# Patient Record
Sex: Female | Born: 1970 | Race: White | Hispanic: No | Marital: Married | Smoking: Never smoker
Health system: Southern US, Community
[De-identification: ages and names within clinical notes are randomized; demographics above are authoritative.]

## PROBLEM LIST (undated history)

## (undated) DIAGNOSIS — M8580 Other specified disorders of bone density and structure, unspecified site: Secondary | ICD-10-CM

## (undated) DIAGNOSIS — E079 Disorder of thyroid, unspecified: Secondary | ICD-10-CM

## (undated) DIAGNOSIS — E785 Hyperlipidemia, unspecified: Secondary | ICD-10-CM

## (undated) HISTORY — PX: EYE SURGERY: SHX253

## (undated) HISTORY — DX: Other specified disorders of bone density and structure, unspecified site: M85.80

## (undated) HISTORY — DX: Disorder of thyroid, unspecified: E07.9

## (undated) HISTORY — PX: FOOT SURGERY: SHX648

## (undated) HISTORY — PX: OTHER SURGICAL HISTORY: SHX169

## (undated) HISTORY — DX: Hyperlipidemia, unspecified: E78.5

---

## 1997-08-27 ENCOUNTER — Encounter: Admission: RE | Admit: 1997-08-27 | Discharge: 1997-11-25 | Payer: Self-pay | Admitting: Psychology

## 1998-12-31 ENCOUNTER — Other Ambulatory Visit: Admission: RE | Admit: 1998-12-31 | Discharge: 1998-12-31 | Payer: Self-pay | Admitting: *Deleted

## 1999-07-28 ENCOUNTER — Encounter: Admission: RE | Admit: 1999-07-28 | Discharge: 1999-10-26 | Payer: Self-pay | Admitting: Obstetrics & Gynecology

## 2000-03-10 ENCOUNTER — Inpatient Hospital Stay (HOSPITAL_COMMUNITY): Admission: AD | Admit: 2000-03-10 | Discharge: 2000-03-12 | Payer: Self-pay | Admitting: *Deleted

## 2000-03-14 ENCOUNTER — Encounter: Admission: RE | Admit: 2000-03-14 | Discharge: 2000-04-11 | Payer: Self-pay | Admitting: *Deleted

## 2001-06-12 ENCOUNTER — Other Ambulatory Visit: Admission: RE | Admit: 2001-06-12 | Discharge: 2001-06-12 | Payer: Self-pay | Admitting: *Deleted

## 2002-07-24 ENCOUNTER — Other Ambulatory Visit: Admission: RE | Admit: 2002-07-24 | Discharge: 2002-07-24 | Payer: Self-pay | Admitting: *Deleted

## 2002-07-24 ENCOUNTER — Other Ambulatory Visit: Admission: RE | Admit: 2002-07-24 | Discharge: 2002-07-24 | Payer: Self-pay | Admitting: Obstetrics and Gynecology

## 2003-02-28 ENCOUNTER — Inpatient Hospital Stay (HOSPITAL_COMMUNITY): Admission: AD | Admit: 2003-02-28 | Discharge: 2003-03-03 | Payer: Self-pay | Admitting: *Deleted

## 2003-04-09 ENCOUNTER — Other Ambulatory Visit: Admission: RE | Admit: 2003-04-09 | Discharge: 2003-04-09 | Payer: Self-pay | Admitting: *Deleted

## 2004-12-07 ENCOUNTER — Other Ambulatory Visit: Admission: RE | Admit: 2004-12-07 | Discharge: 2004-12-07 | Payer: Self-pay | Admitting: Obstetrics and Gynecology

## 2005-10-26 ENCOUNTER — Encounter: Admission: RE | Admit: 2005-10-26 | Discharge: 2005-10-26 | Payer: Self-pay | Admitting: Obstetrics and Gynecology

## 2005-12-29 ENCOUNTER — Encounter (INDEPENDENT_AMBULATORY_CARE_PROVIDER_SITE_OTHER): Payer: Self-pay | Admitting: *Deleted

## 2005-12-29 ENCOUNTER — Inpatient Hospital Stay (HOSPITAL_COMMUNITY): Admission: AD | Admit: 2005-12-29 | Discharge: 2005-12-31 | Payer: Self-pay | Admitting: Obstetrics and Gynecology

## 2006-04-19 ENCOUNTER — Ambulatory Visit: Payer: Self-pay | Admitting: Family Medicine

## 2006-12-10 DIAGNOSIS — R599 Enlarged lymph nodes, unspecified: Secondary | ICD-10-CM | POA: Insufficient documentation

## 2006-12-13 ENCOUNTER — Ambulatory Visit: Payer: Self-pay | Admitting: Family Medicine

## 2006-12-30 ENCOUNTER — Ambulatory Visit: Payer: Self-pay | Admitting: Family Medicine

## 2006-12-30 LAB — CONVERTED CEMR LAB
Direct LDL: 141.3 mg/dL
HDL: 68.9 mg/dL (ref >39.0)
Total CHOL/HDL Ratio: 3.3
Triglycerides: 81 mg/dL (ref 0–149)

## 2009-11-05 ENCOUNTER — Ambulatory Visit: Payer: Self-pay | Admitting: Family Medicine

## 2009-11-05 DIAGNOSIS — R55 Syncope and collapse: Secondary | ICD-10-CM | POA: Insufficient documentation

## 2009-11-06 ENCOUNTER — Telehealth: Payer: Self-pay | Admitting: Family Medicine

## 2010-06-23 NOTE — Progress Notes (Signed)
Summary: pain RX  Phone Note Call from Patient   Caller: Patient Call For: Roderick Pee MD Summary of Call: Pt is calling to let Dr. Tawanna Cooler know she is having a lot of pain, and would like to have a pain RX. [facility]) [phone] Initial call taken by: Lynann Beaver CMA,  November 06, 2009 12:45 PM  Follow-up for Phone Call        Vicodin ES dispense 20 tablets directions one half tab nightly p.r.n. for pain.  No refills Follow-up by: Roderick Pee MD,  November 06, 2009 1:38 PM    New/Updated Medications: VICODIN ES 7.5-750 MG TABS (HYDROCODONE-ACETAMINOPHEN) 1/2 tablet q hs as needed pain Prescriptions: VICODIN ES 7.5-750 MG TABS (HYDROCODONE-ACETAMINOPHEN) 1/2 tablet q hs as needed pain  #20 x 0   Entered by:   Lynann Beaver CMA   Authorized by:   Roderick Pee MD   Signed by:   Lynann Beaver CMA on 11/06/2009   Method used:   Telephoned to ...       [facility] [facility] [phone]* (retail)       [address]       [place], [place]  [address]       Ph: [phone]       Fax: [phone]   RxID:   [id number]  Pt. notified.

## 2010-06-23 NOTE — Assessment & Plan Note (Signed)
Summary: pt fainted last night/bumped head,shoulder and elbow/cjr   Vital Signs:  Patient profile:   40 year old female Height:      62 inches Weight:      98 pounds BMI:     17.99 Temp:     97.9 degrees F oral BP sitting:   90 / 60  (left arm) Cuff size:   regular  Vitals Entered By: Kathrynn Speed CMA (November 05, 2009 10:17 AM) CC: Fainted last night, bumped head, shoulder,headache   CC:  Fainted last night, bumped head, shoulder, and headache.  History of Present Illness: Melissa Zuniga is a 40 year old, married female, nonsmoker, who comes in today for evaluation of a syncopal episode.  She states she awoke about 3 a.m. this morning not feeling well.  She had a bed walked to the kitchen began to feel lightheaded and passed out.  Her husband picked her up in the bed.  She went back to sleep.  She feels well today except she has a slight headache.  Some soreness in her arm and her back where she fell and hit the floor.  No neurologic symptoms.  She's never had syncope in the past.  Review of systems otherwise negative.  LMP ............ she has a medication impregnated IUD therefore, she has no periods.  Last tetanus booster ???????  Preventive Screening-Counseling & Management  Alcohol-Tobacco     Smoking Status: never  Caffeine-Diet-Exercise     Does Patient Exercise: yes      Drug Use:  no.    Current Medications (verified): 1)  Pnv  Allergies (verified): No Known Drug Allergies  Past History:  Past medical, surgical, family and social histories (including risk factors) reviewed for relevance to current acute and chronic problems.  Past Medical History: Reviewed history from 12/13/2006 and no changes required. Unremarkable  Past Surgical History: Reviewed history from 12/13/2006 and no changes required. cbx2 r elbow-85' lazy eyes-repair77'  Family History: Reviewed history from 12/13/2006 and no changes required. father had lymphoma, mom high chol.  Social  History: Reviewed history and no changes required. Married Never Smoked Alcohol use-no Drug use-no Regular exercise-yes Drug Use:  no Does Patient Exercise:  yes  Review of Systems      See HPI  Physical Exam  General:  Well-developed,well-nourished,in no acute distress; alert,appropriate and cooperative throughout examination Head:  Normocephalic and atraumatic without obvious abnormalities. No apparent alopecia or balding. Eyes:  No corneal or conjunctival inflammation noted. EOMI. Perrla. Funduscopic exam benign, without hemorrhages, exudates or papilledema. Vision grossly normal. Ears:  External ear exam shows no significant lesions or deformities.  Otoscopic examination reveals clear canals, tympanic membranes are intact bilaterally without bulging, retraction, inflammation or discharge. Hearing is grossly normal bilaterally. Nose:  External nasal examination shows no deformity or inflammation. Nasal mucosa are pink and moist without lesions or exudates. Mouth:  Oral mucosa and oropharynx without lesions or exudates.  Teeth in good repair. Neck:  No deformities, masses, or tenderness noted. Lungs:  Normal respiratory effort, chest expands symmetrically. Lungs are clear to auscultation, no crackles or wheezes. Heart:  Normal rate and regular rhythm. S1 and S2 normal without gallop, murmur, click, rub or other extra sounds. Abdomen:  Bowel sounds positive,abdomen soft and non-tender without masses, organomegaly or hernias noted. Msk:  normal except for an abrasion on her left upper back and left elbow. Pulses:  R and L carotid,radial,femoral,dorsalis pedis and posterior tibial pulses are full and equal bilaterally Extremities:  No clubbing, cyanosis, edema, or  deformity noted with normal full range of motion of all joints.   Neurologic:  No cranial nerve deficits noted. Station and gait are normal. Plantar reflexes are down-going bilaterally. DTRs are symmetrical throughout. Sensory,  motor and coordinative functions appear intact.   Problems:  Medical Problems Added: 1)  Dx of Syncope  (ICD-780.2)  Impression & Recommendations:  Problem # 1:  SYNCOPE (ICD-780.2) Assessment New  Complete Medication List: 1)  Pnv   Other Orders: Tdap => 32yrs IM ([address]) Admin 1st Vaccine (60454)  Patient Instructions: 1)  in the future when you feel lightheaded lie down and put y  feet up in the air. 2)  The sure to drink lots of liquids and stay well hydrated. 3)  Return p.r.n. 4)  cautioned about always, the remote possibility of having a subdural down the road from head trauma, however, it appears her back took most of the injury.  If she develops persistent headache, or any neurologic symptoms.  She is advised to call us ASAP   Immunizations Administered:  Tetanus Vaccine:    Vaccine Type: Tdap    Site: left deltoid    Mfr: GlaxoSmithKline    Dose: 0.5 ml    Route: IM    Given by: Kern Reap CMA (AAMA)    Exp. Date: 08/15/2011    Lot #: [id number]    Physician counseled: yes

## 2014-04-30 ENCOUNTER — Other Ambulatory Visit: Payer: Self-pay | Admitting: Obstetrics and Gynecology

## 2014-05-01 LAB — CYTOLOGY - PAP

## 2015-10-28 DIAGNOSIS — H66002 Acute suppurative otitis media without spontaneous rupture of ear drum, left ear: Secondary | ICD-10-CM | POA: Insufficient documentation

## 2015-11-05 DIAGNOSIS — H6983 Other specified disorders of Eustachian tube, bilateral: Secondary | ICD-10-CM | POA: Insufficient documentation

## 2015-11-05 DIAGNOSIS — H9311 Tinnitus, right ear: Secondary | ICD-10-CM | POA: Insufficient documentation

## 2015-11-05 DIAGNOSIS — H900 Conductive hearing loss, bilateral: Secondary | ICD-10-CM | POA: Insufficient documentation

## 2018-02-02 ENCOUNTER — Other Ambulatory Visit: Payer: Self-pay | Admitting: Radiology

## 2018-02-02 DIAGNOSIS — N6320 Unspecified lump in the left breast, unspecified quadrant: Secondary | ICD-10-CM

## 2018-02-08 ENCOUNTER — Ambulatory Visit
Admission: RE | Admit: 2018-02-08 | Discharge: 2018-02-08 | Disposition: A | Discharge status: Home / Self Care | Payer: BLUE CROSS/BLUE SHIELD | Source: Ambulatory Visit | Attending: Radiology | Admitting: Radiology

## 2018-02-08 DIAGNOSIS — N6320 Unspecified lump in the left breast, unspecified quadrant: Secondary | ICD-10-CM

## 2019-06-26 ENCOUNTER — Ambulatory Visit: Payer: Self-pay

## 2019-06-26 ENCOUNTER — Other Ambulatory Visit: Payer: Self-pay

## 2019-06-26 ENCOUNTER — Encounter: Payer: Self-pay | Admitting: Podiatry

## 2019-07-24 NOTE — Progress Notes (Signed)
This encounter was created in error - please disregard.

## 2019-08-02 ENCOUNTER — Ambulatory Visit: Payer: Self-pay | Attending: Internal Medicine

## 2019-08-02 DIAGNOSIS — Z23 Encounter for immunization: Secondary | ICD-10-CM

## 2019-08-02 NOTE — Progress Notes (Signed)
   Covid-19 Vaccination Clinic  Name:  Melissa Zuniga    MRN: [id number] DOB: 1971-04-28  08/02/2019  Ms. Melissa Zuniga was observed post Covid-19 immunization for 15 minutes without incident. She was provided with Vaccine Information Sheet and instruction to access the V-Safe system.   Ms. Melissa Zuniga was instructed to call 911 with any severe reactions post vaccine: Marland Kitchen Difficulty breathing  . Swelling of face and throat  . A fast heartbeat  . A bad rash all over body  . Dizziness and weakness   Immunizations Administered    Name Date Dose VIS Date Route   Pfizer COVID-19 Vaccine 08/02/2019  9:39 AM 0.3 mL 05/04/2019 Intramuscular   Manufacturer: [employer], [employer]   Lot: [id number]   NDC: [address]-8

## 2019-08-27 ENCOUNTER — Ambulatory Visit: Payer: Self-pay | Attending: Internal Medicine

## 2019-08-27 DIAGNOSIS — Z23 Encounter for immunization: Secondary | ICD-10-CM

## 2019-08-27 NOTE — Progress Notes (Signed)
   Covid-19 Vaccination Clinic  Name:  Melissa Zuniga    MRN: [id number] DOB: [birth year]  08/27/2019  Melissa Zuniga was observed post Covid-19 immunization for 15 minutes without incident. She was provided with Vaccine Information Sheet and instruction to access the V-Safe system.   Melissa Zuniga was instructed to call 911 with any severe reactions post vaccine: Marland Kitchen Difficulty breathing  . Swelling of face and throat  . A fast heartbeat  . A bad rash all over body  . Dizziness and weakness   Immunizations Administered    Name Date Dose VIS Date Route   Pfizer COVID-19 Vaccine 08/27/2019  9:06 AM 0.3 mL 05/04/2019 Intramuscular   Manufacturer: [employer], [employer]   Lot: [id number]   NDC: [address]-4

## 2019-11-13 ENCOUNTER — Other Ambulatory Visit: Payer: Self-pay | Admitting: Podiatry

## 2019-11-13 ENCOUNTER — Ambulatory Visit (INDEPENDENT_AMBULATORY_CARE_PROVIDER_SITE_OTHER): Payer: BLUE CROSS/BLUE SHIELD

## 2019-11-13 ENCOUNTER — Encounter: Payer: Self-pay | Admitting: Podiatry

## 2019-11-13 ENCOUNTER — Ambulatory Visit: Payer: BLUE CROSS/BLUE SHIELD | Admitting: Podiatry

## 2019-11-13 ENCOUNTER — Other Ambulatory Visit: Payer: Self-pay

## 2019-11-13 DIAGNOSIS — M2011 Hallux valgus (acquired), right foot: Secondary | ICD-10-CM

## 2019-11-13 DIAGNOSIS — M2041 Other hammer toe(s) (acquired), right foot: Secondary | ICD-10-CM

## 2019-11-13 DIAGNOSIS — M2042 Other hammer toe(s) (acquired), left foot: Secondary | ICD-10-CM

## 2019-11-13 MED ORDER — MELOXICAM 15 MG PO TABS: 15.0000 mg | ORAL_TABLET | Freq: Every day | ORAL | 3 refills | Status: DC

## 2019-11-13 NOTE — Progress Notes (Signed)
  Subjective:  Patient ID: Melissa Zuniga, female    DOB: 07-24-1970,  MRN: [id number] HPI Chief Complaint  Patient presents with  . 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 New Patient (Initial Visit)    49 y.o. female presents with the above complaint.   ROS: Denies fever chills nausea vomiting muscle aches pains calf pain back pain chest pain shortness of breath.  No past medical history on file.   Current Outpatient Medications:  .  Levothyroxine Sodium (LEVOTHROID PO), Take by mouth., Disp: , Rfl:  .  meloxicam (MOBIC) 15 MG tablet, Take 1 tablet (15 mg total) by mouth daily., Disp: 30 tablet, Rfl: 3  No Known Allergies Review of Systems Objective:  There were no vitals filed for this visit.  General: Well developed, nourished, in no acute distress, alert and oriented x3   Dermatological: Skin is warm, dry and supple bilateral. Nails x 10 are well maintained; remaining integument appears unremarkable at this time. There are no open sores, no preulcerative lesions, no rash or signs of infection present.  Vascular: Dorsalis Pedis artery and Posterior Tibial artery pedal pulses are 2/4 bilateral with immedate capillary fill time. Pedal hair growth present. No varicosities and no lower extremity edema present bilateral.   Neruologic: Grossly intact via light touch bilateral. Vibratory intact via tuning fork bilateral. Protective threshold with Semmes Wienstein monofilament intact to all pedal sites bilateral. Patellar and Achilles deep tendon reflexes 2+ bilateral. No Babinski or clonus noted bilateral.   Musculoskeletal: No gross boney pedal deformities bilateral. No pain, crepitus, or limitation noted with foot and ankle range of motion bilateral. Muscular strength 5/5 in all groups tested bilateral.  Gait: Unassisted, Nonantalgic.    Radiographs:  Radiographs taken today demonstrate an  osseously mature individual with an increase in the first intermetatarsal angle of the right foot greater than normal value and hallux abductus angle greater than normal value.  Also demonstrates an increase in the fourth intermetatarsal angle greater than normal value with adductus of these fifth toe right foot.  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  Plan: Discussed etiology pathology conservative surgical therapies at this point due to the pain that this patient is experiencing into the inability perform her daily activities without pain surgery is indicated.  We consented her today for an Eliberto Ivory type bunionectomy with screw fixation as well as the fifth metatarsal osteotomy with screw fixation.  We discussed the pros and cons of surgery as well as possible postop complications which may include but not limited to postop pain bleeding swelling infection recurrence need for further surgery overcorrection under correction loss of digit loss of limb loss of life.  We dispensed information regarding the surgery center, the anesthesia group and information for the morning of surgery.  We also dispensed a Cam walker which she will wear postoperatively.  I will follow-up with her in the near future for surgery.     Melissa Zuniga T. [place], [place]

## 2020-01-06 ENCOUNTER — Telehealth: Payer: Self-pay | Admitting: *Deleted

## 2020-01-06 NOTE — Telephone Encounter (Signed)
DOS 01/18/2020 Melissa Zuniga - [id number] AND METATARSAL OSTEOTOMY 5TH - 28308 OF THE RIGHT FOOT  BCBS: Policy Effective 05/25/2019 - 05/23/2198  Deductible ($1,250.00) / $0.00 Paid / $1,250.00 to go Out-of-Pocket Limit ($4,000.00) / $50.00 Paid / $3,950.00 to go Co-insurance - 20%   Authorization is NOT REQUIRED.

## 2020-01-16 ENCOUNTER — Telehealth: Payer: Self-pay | Admitting: Podiatry

## 2020-01-16 NOTE — Telephone Encounter (Signed)
I'm scheduled for sx Friday morning with Dr. Al Corpus and I have a question about the antibacterial scrub brush. It says to wash the area the night before surgery. I was wondering would I be able to put a sock on my foot after I showered and washed with this. I informed the pt she could put a clean sock on after washing the surgery foot, ankle and leg. Pt confirmed she could not shower the morning of her sx, and I told her correct, to only shower the night before her sx.

## 2020-01-17 ENCOUNTER — Other Ambulatory Visit: Payer: Self-pay | Admitting: Podiatry

## 2020-01-17 MED ORDER — OXYCODONE-ACETAMINOPHEN 10-325 MG PO TABS: 1.0000 | ORAL_TABLET | Freq: Three times a day (TID) | ORAL | 0 refills | Status: AC | PRN

## 2020-01-17 MED ORDER — ONDANSETRON HCL 4 MG PO TABS: 4.0000 mg | ORAL_TABLET | Freq: Three times a day (TID) | ORAL | 0 refills | Status: DC | PRN

## 2020-01-17 MED ORDER — CEPHALEXIN 500 MG PO CAPS: 500.0000 mg | ORAL_CAPSULE | Freq: Three times a day (TID) | ORAL | 0 refills | Status: DC

## 2020-01-18 ENCOUNTER — Telehealth: Payer: Self-pay | Admitting: Sports Medicine

## 2020-01-18 DIAGNOSIS — M21541 Acquired clubfoot, right foot: Secondary | ICD-10-CM

## 2020-01-18 DIAGNOSIS — M2011 Hallux valgus (acquired), right foot: Secondary | ICD-10-CM

## 2020-01-18 NOTE — Telephone Encounter (Signed)
Patient's husband called and had post op questions re: boot I advised him to keep boot on when ambulating may take boot off for brief moments when sitting/resting. Advised to keep dressing clean dry and intact

## 2020-01-19 ENCOUNTER — Encounter: Payer: Self-pay | Admitting: Sports Medicine

## 2020-01-19 NOTE — Postoperative (Signed)
Husband called stating that wife is in a lot of pain at 2am. I advised husband to continue with ice and elevation and to loosen the ace bandage. I also advised husband to stay consistent with pain medication and at 3am may give wife another pain pill and to supplement in between doses with Motrin. Husband expresses understanding and was told to call back if pain is not improved.

## 2020-01-24 ENCOUNTER — Ambulatory Visit (INDEPENDENT_AMBULATORY_CARE_PROVIDER_SITE_OTHER): Payer: BLUE CROSS/BLUE SHIELD

## 2020-01-24 ENCOUNTER — Ambulatory Visit (INDEPENDENT_AMBULATORY_CARE_PROVIDER_SITE_OTHER): Payer: BLUE CROSS/BLUE SHIELD | Admitting: Podiatry

## 2020-01-24 ENCOUNTER — Other Ambulatory Visit: Payer: Self-pay

## 2020-01-24 ENCOUNTER — Encounter: Payer: Self-pay | Admitting: Podiatry

## 2020-01-24 DIAGNOSIS — M21621 Bunionette of right foot: Secondary | ICD-10-CM

## 2020-01-24 DIAGNOSIS — M2011 Hallux valgus (acquired), right foot: Secondary | ICD-10-CM | POA: Diagnosis not present

## 2020-01-24 DIAGNOSIS — Z9889 Other specified postprocedural states: Secondary | ICD-10-CM

## 2020-01-24 NOTE — Progress Notes (Signed)
She presents today for her first postop visit date of surgery 01/18/2020 status post Eliberto Ivory bunionectomy with screw fixation and fifth metatarsal osteotomy with screw fixation all in the right foot.  States that is been really sore in the boot feels like is pushing on the top of the foot.  She denies fever chills nausea vomiting muscle aches pains calf pain back pain chest pain shortness of breath.  Objective: Presents today with her cam walker using Lofstrand crutches.  Dressed her dressing intact was removed demonstrates very little bleeding there is minimal edema no erythema cellulitis drainage or odor incisions appear to be healing very nicely she got good range of motion of the first metatarsophalangeal joint.  Radiographs taken today demonstrate well-positioned osteotomies of the first and fifth metatarsals with internal fixation in good position and no loosening.  Assessment: Well-healing surgical foot x1 week.  Plan: Redressed today dressed a compressive dressing follow-up with her in 1 week for reevaluation and dressing.

## 2020-01-31 ENCOUNTER — Ambulatory Visit (INDEPENDENT_AMBULATORY_CARE_PROVIDER_SITE_OTHER): Payer: BLUE CROSS/BLUE SHIELD | Admitting: Podiatry

## 2020-01-31 ENCOUNTER — Encounter: Payer: Self-pay | Admitting: Podiatry

## 2020-01-31 ENCOUNTER — Other Ambulatory Visit: Payer: Self-pay

## 2020-01-31 DIAGNOSIS — M2011 Hallux valgus (acquired), right foot: Secondary | ICD-10-CM

## 2020-01-31 DIAGNOSIS — Z9889 Other specified postprocedural states: Secondary | ICD-10-CM

## 2020-01-31 DIAGNOSIS — M21621 Bunionette of right foot: Secondary | ICD-10-CM

## 2020-01-31 NOTE — Progress Notes (Signed)
She presents today date of surgery 01/18/2020 status post Eliberto Ivory bunionectomy with screw right and fifth metatarsal osteotomy with screws right.  States that is been hurting but it seems to be doing a lot better than it was it looks better than it did.  I am she presents with her cam walker.  Objective: Vitals are stable she is alert oriented x3 dressed her dressing intact once removed demonstrates sutures are intact margins well coapted she had great range of motion dorsiflexion plantarflexion of the right foot.  Assessment: Well-healing surgical foot.  Plan: Remove sutures today placed her compression anklet and a Darco shoe encouraged range of motion exercises we will follow-up with her in 2 weeks.  We will let her start washing this and placing lotion on it.

## 2020-02-14 ENCOUNTER — Ambulatory Visit (INDEPENDENT_AMBULATORY_CARE_PROVIDER_SITE_OTHER): Payer: BLUE CROSS/BLUE SHIELD | Admitting: Podiatry

## 2020-02-14 ENCOUNTER — Encounter: Payer: Self-pay | Admitting: Podiatry

## 2020-02-14 ENCOUNTER — Other Ambulatory Visit: Payer: Self-pay

## 2020-02-14 ENCOUNTER — Ambulatory Visit (INDEPENDENT_AMBULATORY_CARE_PROVIDER_SITE_OTHER): Payer: BLUE CROSS/BLUE SHIELD

## 2020-02-14 DIAGNOSIS — M2011 Hallux valgus (acquired), right foot: Secondary | ICD-10-CM

## 2020-02-14 DIAGNOSIS — Z9889 Other specified postprocedural states: Secondary | ICD-10-CM

## 2020-02-14 DIAGNOSIS — M21621 Bunionette of right foot: Secondary | ICD-10-CM

## 2020-02-14 NOTE — Progress Notes (Signed)
She presents today status post [place] bunionectomy fifth metatarsal osteotomy both internal fixation she states that still painful but is tolerable.  Seems to be improving as far as the scars go as far as the swelling goes.  She denies fever chills nausea vomiting muscle aches pains calf pain back pain chest pain shortness of breath.  Objective: Vital signs are stable alert oriented x3 anklet in place was removed demonstrates scars are healing very nicely very minimal scarring whatsoever edema has come down considerably she has fantastic range of motion of both surgical sites.  Radiographs demonstrate 4 weeks out nearly healed osteotomies internal fixation is good position with not without loosening.  Assessment: Well-healing surgical foot.  Plan: I will allow her to try to get into some loose fitting shoes or sandals if not she will stay in her Darco shoes I will follow-up with her in about 2 to 3 weeks.

## 2020-02-28 ENCOUNTER — Other Ambulatory Visit: Payer: Self-pay

## 2020-02-28 ENCOUNTER — Ambulatory Visit (INDEPENDENT_AMBULATORY_CARE_PROVIDER_SITE_OTHER): Payer: BLUE CROSS/BLUE SHIELD

## 2020-02-28 ENCOUNTER — Ambulatory Visit (INDEPENDENT_AMBULATORY_CARE_PROVIDER_SITE_OTHER): Payer: BLUE CROSS/BLUE SHIELD | Admitting: Podiatry

## 2020-02-28 DIAGNOSIS — M2011 Hallux valgus (acquired), right foot: Secondary | ICD-10-CM

## 2020-02-28 DIAGNOSIS — M21621 Bunionette of right foot: Secondary | ICD-10-CM

## 2020-02-28 DIAGNOSIS — Z9889 Other specified postprocedural states: Secondary | ICD-10-CM

## 2020-02-28 NOTE — Progress Notes (Signed)
She presents today date of surgery 01/18/2020 [facility] bunionectomy with screw fixation fifth metatarsal osteotomy with screw fixation right foot states that it is really doing pretty well  Objective: Vital signs are stable alert oriented x3 there is no erythema cellulitis drainage or odor is mild edema noticed on physical exam as well as on x-ray.  Radiographs taken today demonstrate well healing first and fifth osteotomies with internal screw fixation fixation is intact and tight.  Assessment: Well-healing surgical foot.  Plan: I encouraged her to get back into regular shoe gear I will follow-up with her in 1 month.

## 2020-03-25 ENCOUNTER — Other Ambulatory Visit: Payer: Self-pay

## 2020-03-25 ENCOUNTER — Ambulatory Visit (INDEPENDENT_AMBULATORY_CARE_PROVIDER_SITE_OTHER): Payer: BLUE CROSS/BLUE SHIELD | Admitting: Podiatry

## 2020-03-25 ENCOUNTER — Ambulatory Visit (INDEPENDENT_AMBULATORY_CARE_PROVIDER_SITE_OTHER): Payer: BLUE CROSS/BLUE SHIELD

## 2020-03-25 DIAGNOSIS — M2011 Hallux valgus (acquired), right foot: Secondary | ICD-10-CM

## 2020-03-25 DIAGNOSIS — Z9889 Other specified postprocedural states: Secondary | ICD-10-CM

## 2020-03-25 DIAGNOSIS — M21621 Bunionette of right foot: Secondary | ICD-10-CM

## 2020-03-25 NOTE — Progress Notes (Signed)
She presents today for postop visit date of surgery 01/18/2020 [facility] bunionectomy with screw and 1/5 metatarsal osteotomy of the right foot with double screw fixation.  She states that she is doing quite well she is just a little anxious and wants to get back to regular activity.  She states that she notices improvement all the time but wants it to be well so she can get back to doing her normal routine.  Objective: Vital signs are stable alert oriented x3 pulses are palpable.  There is no erythema edema cellulitis drainage or odor she has good range of motion of the first metatarsophalangeal joint though there is still some swelling just along the medial aspect and the lateral aspect of the foot but much decreased.  Radiographs demonstrate well healing osteotomies internal fixation appears to be in good position without loosening.  Assessment: Well-healing surgical foot.  Plan: Encouraged her to continue physical therapy range of motion exercises as well as massage therapy between the intermetatarsal space she understands this and is amenable to it and I will follow-up with her in 1 month.  Another set of x-rays will be done at that time

## 2020-04-06 ENCOUNTER — Ambulatory Visit: Payer: Self-pay | Attending: Internal Medicine

## 2020-04-06 DIAGNOSIS — Z23 Encounter for immunization: Secondary | ICD-10-CM

## 2020-04-06 NOTE — Progress Notes (Signed)
   Covid-19 Vaccination Clinic  Name:  Melissa Zuniga    MRN: [id number] DOB: Apr 17, 1971  04/06/2020  Ms. Melissa Zuniga was observed post Covid-19 immunization for 15 minutes without incident. She was provided with Vaccine Information Sheet and instruction to access the V-Safe system.   Ms. Melissa Zuniga was instructed to call 911 with any severe reactions post vaccine: Marland Kitchen Difficulty breathing  . Swelling of face and throat  . A fast heartbeat  . A bad rash all over body  . Dizziness and weakness   Immunizations Administered    Name Date Dose VIS Date Route   Pfizer COVID-19 Vaccine 04/06/2020 12:45 PM 0.3 mL 03/12/2020 Intramuscular   Manufacturer: [employer], [employer]   Lot: [id number]   NDC: [address]-6

## 2020-04-24 ENCOUNTER — Encounter: Payer: BLUE CROSS/BLUE SHIELD | Admitting: Podiatry

## 2020-05-08 ENCOUNTER — Encounter: Payer: Self-pay | Admitting: Podiatry

## 2020-05-08 ENCOUNTER — Ambulatory Visit (INDEPENDENT_AMBULATORY_CARE_PROVIDER_SITE_OTHER): Payer: BLUE CROSS/BLUE SHIELD | Admitting: Podiatry

## 2020-05-08 ENCOUNTER — Ambulatory Visit (INDEPENDENT_AMBULATORY_CARE_PROVIDER_SITE_OTHER): Payer: BLUE CROSS/BLUE SHIELD

## 2020-05-08 ENCOUNTER — Other Ambulatory Visit: Payer: Self-pay

## 2020-05-08 DIAGNOSIS — M2011 Hallux valgus (acquired), right foot: Secondary | ICD-10-CM | POA: Diagnosis not present

## 2020-05-08 DIAGNOSIS — M21621 Bunionette of right foot: Secondary | ICD-10-CM

## 2020-05-08 DIAGNOSIS — Z9889 Other specified postprocedural states: Secondary | ICD-10-CM

## 2020-05-08 NOTE — Progress Notes (Signed)
She presents today for postop visit status post Austin bunionectomy and fifth metatarsal osteotomy.  States that swelling is better but the soreness is still there.  Objective: Vital signs are stable she alert oriented x3 presents today wearing regular shoes.  There is no erythema to some mild edema in the first and metatarsal space no cellulitis drainage or odor she has good range of motion of the first metatarsophalangeal joint dorsiflexion and plantarflexion no pain on palpation of the fifth metatarsophalangeal joint just moderate pain on palpation to the entire first metatarsal distally.  Radiographs taken today demonstrate an osseously mature individual with 2 screws to the fifth metatarsal of the right foot 1 screw to the head of the first metatarsal right foot.  No signs of infection much decrease in edema from previous evaluations.  The osteotomy sites are going on to heal very nicely remodeling is occurring.  Assessment: Well-healing surgical foot.  Plan: I encouraged her to continue massage therapy and motion.  On her follow-up with her in about 2 months at which time we may discuss removing internal fixation.

## 2021-01-22 ENCOUNTER — Ambulatory Visit (INDEPENDENT_AMBULATORY_CARE_PROVIDER_SITE_OTHER): Payer: BLUE CROSS/BLUE SHIELD

## 2021-01-22 ENCOUNTER — Other Ambulatory Visit: Payer: Self-pay

## 2021-01-22 ENCOUNTER — Ambulatory Visit: Payer: BLUE CROSS/BLUE SHIELD | Admitting: Podiatry

## 2021-01-22 DIAGNOSIS — M19071 Primary osteoarthritis, right ankle and foot: Secondary | ICD-10-CM

## 2021-01-22 DIAGNOSIS — M67472 Ganglion, left ankle and foot: Secondary | ICD-10-CM

## 2021-01-22 DIAGNOSIS — M2011 Hallux valgus (acquired), right foot: Secondary | ICD-10-CM | POA: Diagnosis not present

## 2021-01-22 DIAGNOSIS — M778 Other enthesopathies, not elsewhere classified: Secondary | ICD-10-CM | POA: Diagnosis not present

## 2021-01-24 NOTE — Progress Notes (Signed)
She presents today for follow-up after having not seen her for a year stating that she is having pain in her surgical foot at the surgical site.  States that is quite tender there as she refers to the first metatarsophalangeal joint of her right foot.  She also believes that she has a cyst on the top of her left foot.  She points to the medial aspect of the first metatarsophalangeal joint left.  Objective: Vital signs are stable alert and oriented x3 reviewed her past medical history medications allergies surgery social history pulses are palpable.  She has no pain on simple range of motion of the first metatarsophalangeal joint does however have pain on palpation at the site of the screw and on the dorsal aspect with sharp dorsiflexion at the level of the metatarsophalangeal joint.  Left foot demonstrates a small firm ganglion cyst to the hypertrophic medial condyle area of the first metatarsal left foot fairly movable but does not appear to be fluctuant.  Radiographs taken today demonstrate an osseously mature individual with internal fixation to the first and fifth metatarsals of the right foot.  No dorsal spurring or joint space narrowing subchondral sclerosis or eburnation.  There has been some backing out of the screw from the first metatarsal but no fractures.  Some soft tissue swelling along the fifth metatarsal phalangeal joint area.  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  Ganglion cyst left foot  Plan: Discussed etiology pathology conservative surgical therapies at this point considered her for a CT scan of the forefoot for the first metatarsophalangeal joint evaluation of a painful surgical joint possible fixation failure or osteoarthritis or chronic capsulitis.

## 2021-01-27 NOTE — Addendum Note (Signed)
Addended by: Lottie Rater E on: 01/27/2021 12:25 PM   Modules accepted: Orders

## 2021-02-02 ENCOUNTER — Telehealth: Payer: Self-pay | Admitting: Podiatry

## 2021-02-02 NOTE — Telephone Encounter (Signed)
[facility] has been working in prior [place]. They have done pre-cert (bcbs-auth# [id number], [id number] exp 03/03/21) and have reached out to their schedulers to contact the patient. Patient notified!

## 2021-02-02 NOTE — Telephone Encounter (Signed)
Patient called the office concerned that nobody  has called her about making an appointment for her CT scan .

## 2021-02-05 ENCOUNTER — Other Ambulatory Visit: Payer: Self-pay

## 2021-02-05 ENCOUNTER — Ambulatory Visit
Admission: RE | Admit: 2021-02-05 | Discharge: 2021-02-05 | Disposition: A | Discharge status: Home / Self Care | Payer: Self-pay | Source: Ambulatory Visit | Attending: Podiatry | Admitting: Podiatry

## 2021-02-05 DIAGNOSIS — M19071 Primary osteoarthritis, right ankle and foot: Secondary | ICD-10-CM

## 2021-02-05 DIAGNOSIS — M778 Other enthesopathies, not elsewhere classified: Secondary | ICD-10-CM

## 2021-02-17 ENCOUNTER — Encounter: Payer: Self-pay | Admitting: Podiatry

## 2021-02-17 ENCOUNTER — Ambulatory Visit: Payer: BLUE CROSS/BLUE SHIELD | Admitting: Podiatry

## 2021-02-17 ENCOUNTER — Other Ambulatory Visit: Payer: Self-pay

## 2021-02-17 DIAGNOSIS — M2011 Hallux valgus (acquired), right foot: Secondary | ICD-10-CM | POA: Diagnosis not present

## 2021-02-17 DIAGNOSIS — M19071 Primary osteoarthritis, right ankle and foot: Secondary | ICD-10-CM | POA: Diagnosis not present

## 2021-02-17 DIAGNOSIS — M21621 Bunionette of right foot: Secondary | ICD-10-CM | POA: Diagnosis not present

## 2021-02-17 DIAGNOSIS — M67472 Ganglion, left ankle and foot: Secondary | ICD-10-CM

## 2021-02-17 NOTE — Progress Notes (Signed)
She presents today discuss her MRI/CT findings.  States that the foot is still painful no change.  Objective: Vital signs are stable she is alert oriented x3.  She still has tenderness on palpation of the tibial sesamoid and pain on end range of motion of the first metatarsophalangeal joint.  CT scan does demonstrate what appears to be significant arthritis of the tibial sesamoid.  Some moderate arthritis of the joint itself.  Retained screw.  Assessment: Arthritic sesamoiditis.  Plan: Discussed etiology pathology conservative surgical therapies at this point I discussed with her in great detail today the possibility of removing the tibial sesamoid as well as a PRP injection to the first metatarsal phalangeal joint and removal of the screw from the first metatarsal.  She is going to think about this and get back to [place] prior to the end of the year if she wants [place] to do the surgery.

## 2021-09-23 IMAGING — CT CT FOOT*R* W/O CM
2 series · 9 of 27 positions shown, 11 images · non-contrast
Comparison: None.

CLINICAL DATA: Chronic foot pain.

EXAM:
CT OF THE RIGHT FOOT WITHOUT CONTRAST
TECHNIQUE: Multidetector CT imaging of the right foot was performed according
to the standard protocol. Multiplanar CT image reconstructions were
also generated.

[Series 4: soft tissue lower extremity · axial · 0.52mm/px · z∈[-207,-125]mm · 4 of 59 slices shown, 5 images]
[im 9/59  soft-tissue]
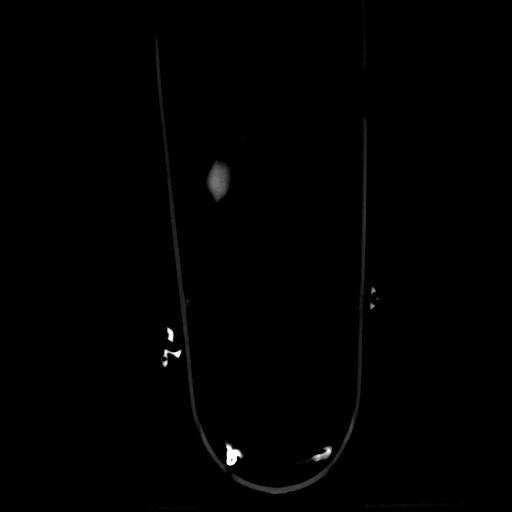
[im 9/59  bone]
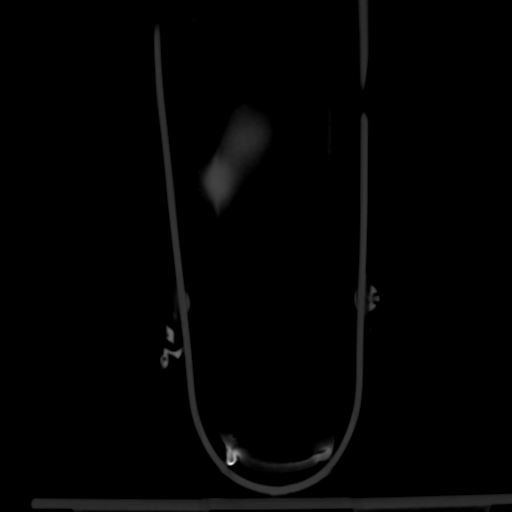
[im 23/59  bone]
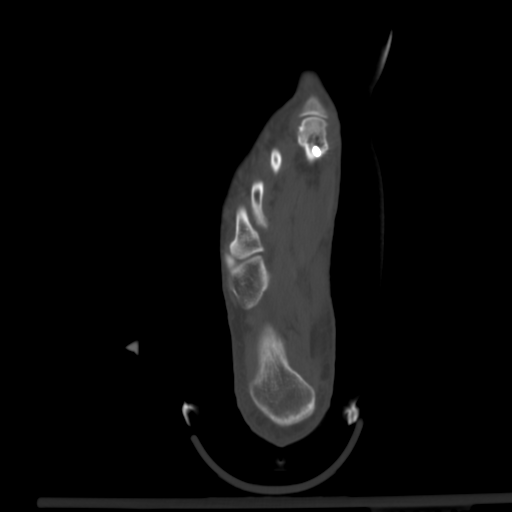
[im 36/59  bone]
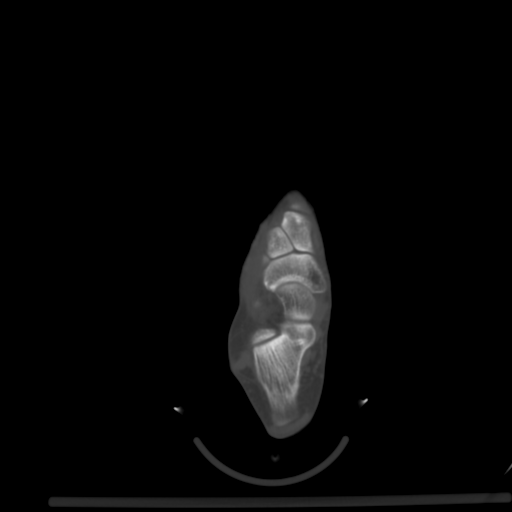
[im 50/59  bone]
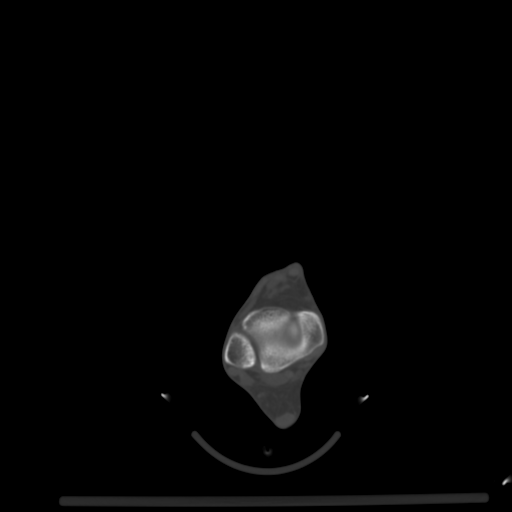

[Series 10: sagsoft tissue · sagittal · 0.19mm/px · 5 of 76 slices shown, 6 images]
[im 26/76  bone]
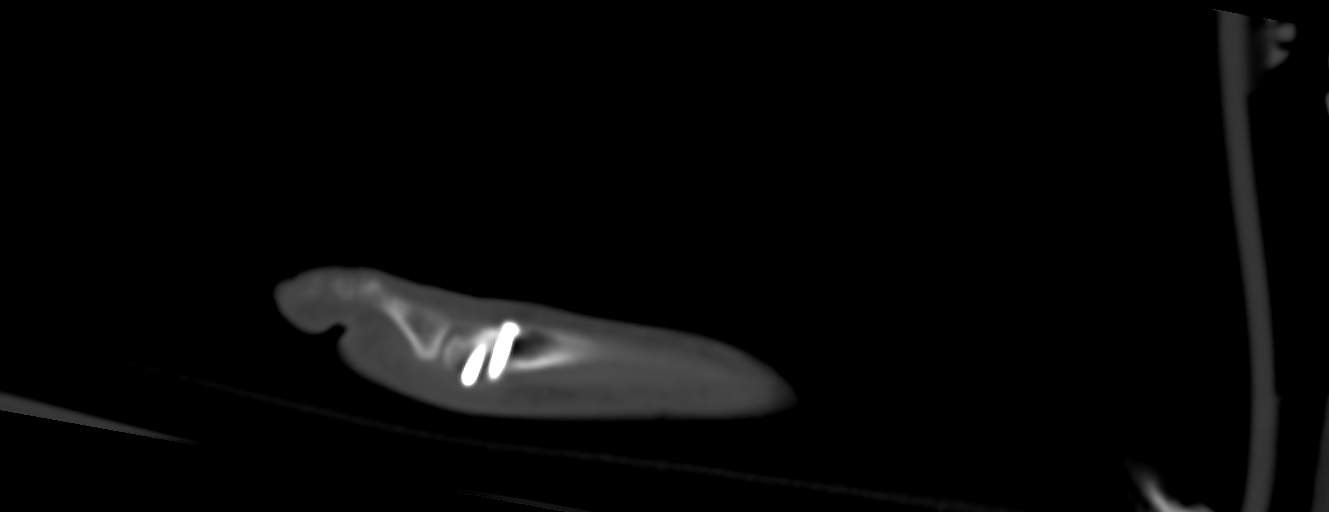
[im 32/76  bone]
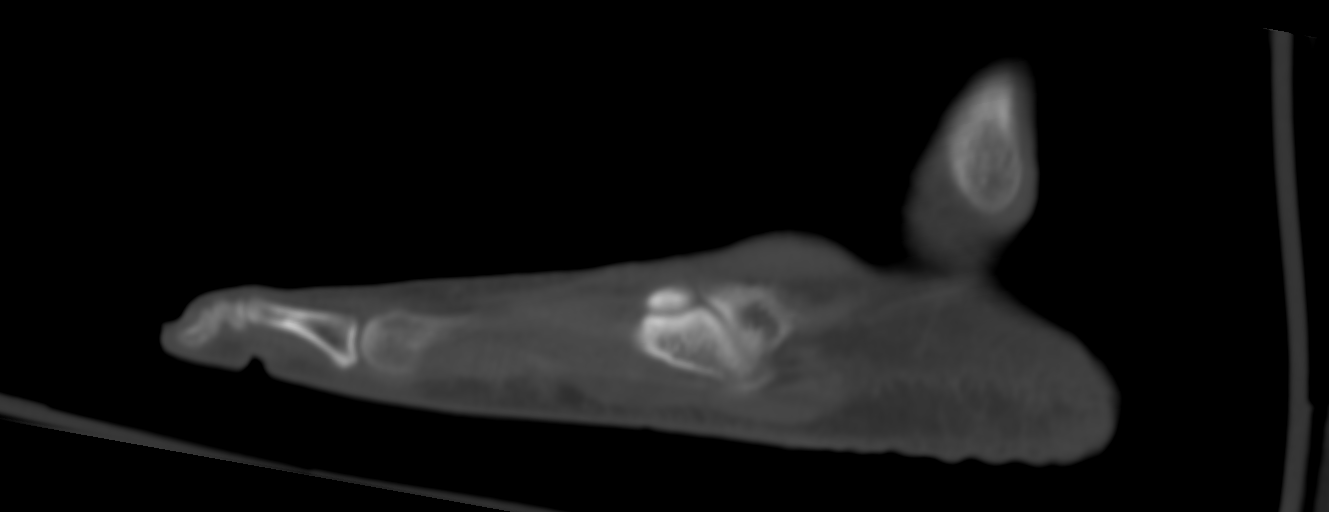
[im 38/76  soft-tissue]
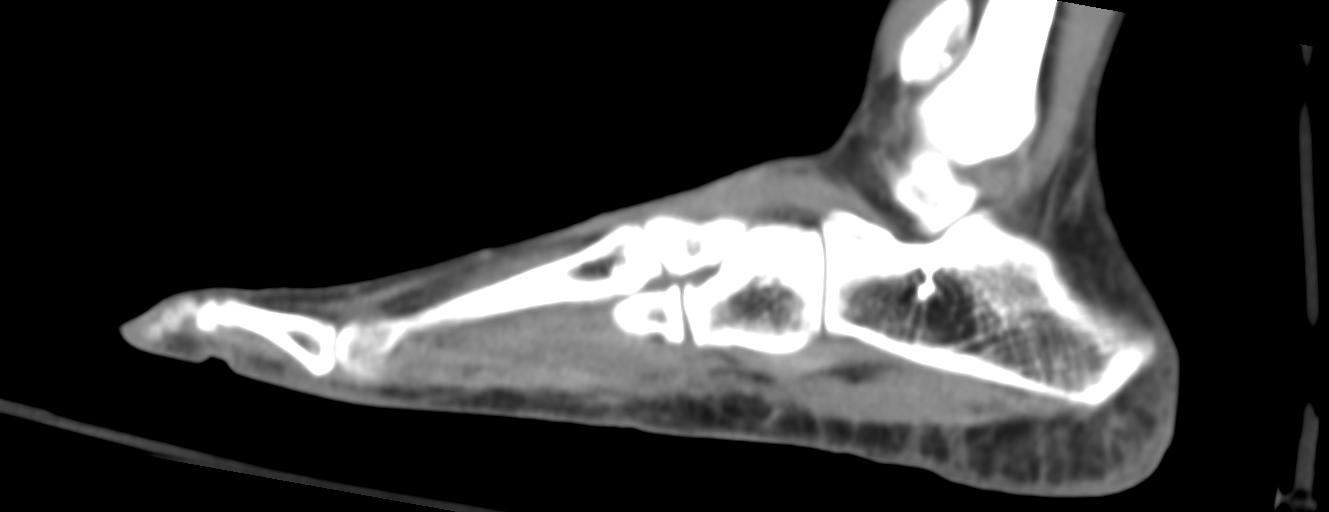
[im 38/76  bone]
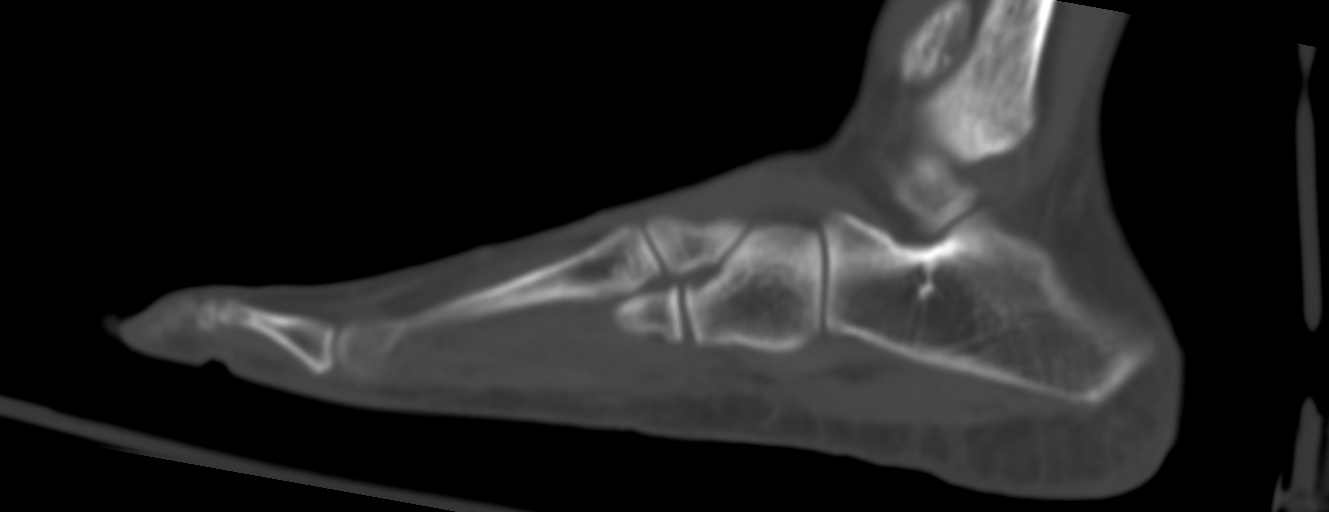
[im 44/76  bone]
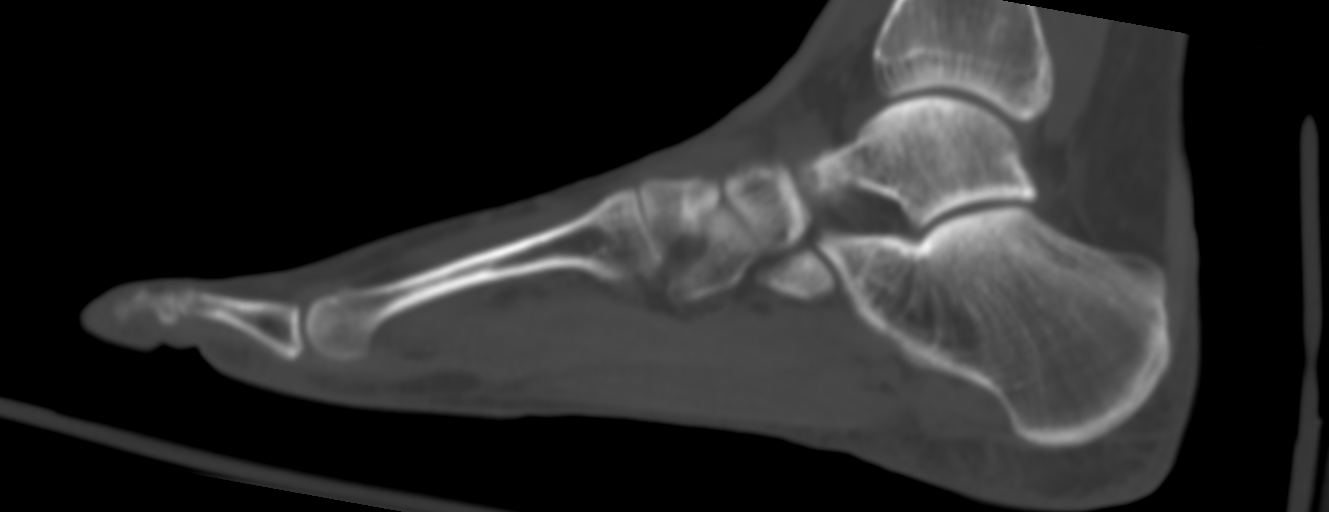
[im 51/76  bone]
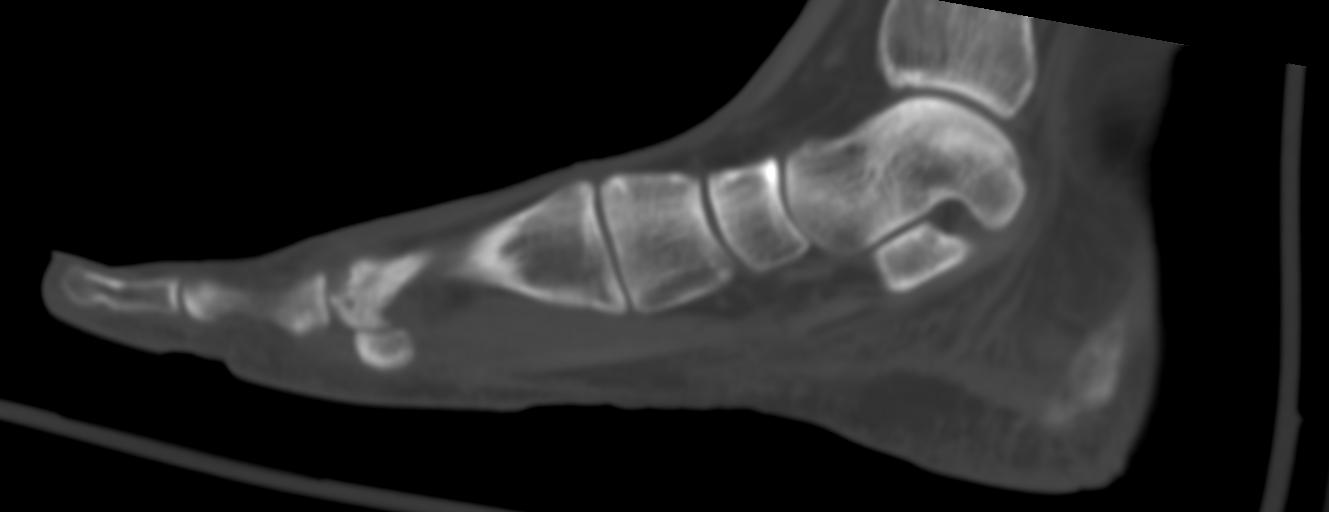

[9 of 27 positions shown; findings below may reference images not displayed]

FINDINGS: Bones/Joint/Cartilage

Postsurgical changes of the first metatarsal neck transfixed with a
single screw.

No acute fracture or dislocation.

Postsurgical changes of the fifth metatarsal head.

Moderate osteoarthritis of the first MTP joint. Severe arthritic
changes of the medial hallux sesamoid-metatarsal head articulation.

Normal alignment. No joint effusion.

Ligaments

Ligaments are suboptimally evaluated by CT.

Muscles and Tendons
Muscles are normal. No muscle atrophy. Flexor, extensor, peroneal
and Achilles tendons are intact. Plantar fascia is intact.

Soft tissue
No fluid collection or hematoma.  No soft tissue mass.
IMPRESSION: 1. Moderate osteoarthritis of the first MTP joint.
2. Severe arthritic changes of the medial hallux sesamoid-metatarsal
head articulation.

## 2023-01-20 ENCOUNTER — Encounter: Payer: Self-pay | Admitting: Internal Medicine

## 2023-02-07 ENCOUNTER — Ambulatory Visit: Payer: 59 | Admitting: *Deleted

## 2023-02-07 VITALS — Ht 62.0 in | Wt 89.0 lb

## 2023-02-07 DIAGNOSIS — Z1211 Encounter for screening for malignant neoplasm of colon: Secondary | ICD-10-CM

## 2023-02-07 MED ORDER — NA SULFATE-K SULFATE-MG SULF 17.5-3.13-1.6 GM/177ML PO SOLN: 1.0000 | Freq: Once | ORAL | 0 refills | Status: AC

## 2023-02-07 NOTE — Progress Notes (Signed)
Pt's name and DOB verified at the beginning of the pre-visit.  Pt denies any difficulty with ambulating,sitting, laying down or rolling side to side Gave both [facility] main # and MD on call # prior to instructions.  No egg or soy allergy known to patient  No issues known to pt with past sedation with any surgeries or procedures Pt denies having issues being intubated Pt has no issues moving head neck or swallowing No FH of Malignant Hyperthermia Pt is not on diet pills Pt is not on home 02  Pt is not on blood thinners  Pt denies issues with constipation  Pt is not on dialysis Pt denise any abnormal heart rhythms  Pt denies any upcoming cardiac testing Pt encouraged to use to use Singlecare or Goodrx to reduce cost  Patient's chart reviewed by Cathlyn Parsons CNRA prior to pre-visit and patient appropriate for the [facility].  Pre-visit completed and red dot placed by patient's name on their procedure day (on provider's schedule).  . Visit by phone Pt states weight is 89 lb Instructed pt why it is important to and  to call if they have any changes in health or new medications. Directed them to the # given and on instructions.   Pt states they will.  Instructions reviewed with pt and pt states understanding. Instructed to review again prior to procedure. Pt states they will.  Instructions sent by mail with coupon and by my chart

## 2023-02-25 ENCOUNTER — Encounter: Payer: Self-pay | Admitting: Internal Medicine

## 2023-03-09 ENCOUNTER — Ambulatory Visit (AMBULATORY_SURGERY_CENTER): Payer: 59 | Admitting: Internal Medicine

## 2023-03-09 ENCOUNTER — Encounter: Payer: Self-pay | Admitting: Internal Medicine

## 2023-03-09 VITALS — BP 90/54 | HR 80 | Temp 97.8°F | Resp 15 | Ht 62.0 in | Wt 89.0 lb

## 2023-03-09 DIAGNOSIS — Z1211 Encounter for screening for malignant neoplasm of colon: Secondary | ICD-10-CM | POA: Diagnosis present

## 2023-03-09 MED ORDER — SODIUM CHLORIDE 0.9 % IV SOLN: 500.0000 mL | Freq: Once | INTRAVENOUS | Status: DC

## 2023-03-09 NOTE — Op Note (Signed)
[facility] Patient Name: Melissa Zuniga Procedure Date: 03/09/2023 9:32 AM MRN: [id number] Endoscopist: Madelyn Brunner [place] , , [id number] Age: 52 Referring MD:  Date of Birth: July 01, 1970 Gender: Female Account #: [id number] Procedure:                Colonoscopy Indications:              Screening for colorectal malignant neoplasm, This                            is the patient's first colonoscopy Medicines:                Monitored Anesthesia Care Procedure:                Pre-Anesthesia Assessment:                           - Prior to the procedure, a History and Physical                            was performed, and patient medications and                            allergies were reviewed. The patient's tolerance of                            previous anesthesia was also reviewed. The risks                            and benefits of the procedure and the sedation                            options and risks were discussed with the patient.                            All questions were answered, and informed consent                            was obtained. Prior Anticoagulants: The patient has                            taken no anticoagulant or antiplatelet agents. ASA                            Grade Assessment: II - A patient with mild systemic                            disease. After reviewing the risks and benefits,                            the patient was deemed in satisfactory condition to                            undergo the procedure.  After obtaining informed consent, the colonoscope                            was passed under direct vision. Throughout the                            procedure, the patient's blood pressure, pulse, and                            oxygen saturations were monitored continuously. The                            [id number]-HQ190L Colonoscope [id number] was introduced                            through the anus and  advanced to the the terminal                            ileum. The colonoscopy was performed without                            difficulty. The patient tolerated the procedure                            well. The quality of the bowel preparation was                            excellent. The terminal ileum, ileocecal valve,                            appendiceal orifice, and rectum were photographed. Scope In: 9:36:57 AM Scope Out: 9:51:45 AM Scope Withdrawal Time: 0 hours 9 minutes 14 seconds  Total Procedure Duration: 0 hours 14 minutes 48 seconds  Findings:                 The terminal ileum appeared normal.                           Non-bleeding internal hemorrhoids were found during                            retroflexion.                           The exam was otherwise without abnormality. Complications:            No immediate complications. Estimated Blood Loss:     Estimated blood loss: none. Impression:               - The examined portion of the ileum was normal.                           - Non-bleeding internal hemorrhoids.                           - The examination was otherwise normal.                           -  No specimens collected. Recommendation:           - Discharge patient to home (with escort).                           - Repeat colonoscopy in 10 years for screening                            purposes.                           - The findings and recommendations were discussed                            with the patient. Dr Particia Lather "Alan Ripper" Leonides Schanz,  03/09/2023 9:54:36 AM

## 2023-03-09 NOTE — Patient Instructions (Addendum)
-   Repeat colonoscopy in 10 years for screening purposes. - The findings and recommendations were discussed with the patient.  YOU HAD AN ENDOSCOPIC PROCEDURE TODAY AT THE [facility]:   Refer to the procedure report that was given to you for any specific questions about what was found during the examination.  If the procedure report does not answer your questions, please call your gastroenterologist to clarify.  If you requested that your care partner not be given the details of your procedure findings, then the procedure report has been included in a sealed envelope for you to review at your convenience later.  YOU SHOULD EXPECT: Some feelings of bloating in the abdomen. Passage of more gas than usual.  Walking can help get rid of the air that was put into your GI tract during the procedure and reduce the bloating. If you had a lower endoscopy (such as a colonoscopy or flexible sigmoidoscopy) you may notice spotting of blood in your stool or on the toilet paper. If you underwent a bowel prep for your procedure, you may not have a normal bowel movement for a few days.  Please Note:  You might notice some irritation and congestion in your nose or some drainage.  This is from the oxygen used during your procedure.  There is no need for concern and it should clear up in a day or so.  SYMPTOMS TO REPORT IMMEDIATELY:  Following lower endoscopy (colonoscopy or flexible sigmoidoscopy):  Excessive amounts of blood in the stool  Significant tenderness or worsening of abdominal pains  Swelling of the abdomen that is new, acute  Fever of 100F or higher  For urgent or emergent issues, a gastroenterologist can be reached at any hour by calling [phone]. Do not use MyChart messaging for urgent concerns.    DIET:  We do recommend a small meal at first, but then you may proceed to your regular diet.  Drink plenty of fluids but you should avoid alcoholic beverages for 24 hours.  ACTIVITY:   You should plan to take it easy for the rest of today and you should NOT DRIVE or use heavy machinery until tomorrow (because of the sedation medicines used during the test).    FOLLOW UP: Our staff will call the number listed on your records the next business day following your procedure.  We will call around 7:15- 8:00 am to check on you and address any questions or concerns that you may have regarding the information given to you following your procedure. If we do not reach you, we will leave a message.     If any biopsies were taken you will be contacted by phone or by letter within the next 1-3 weeks.  Please call us at [phone] if you have not heard about the biopsies in 3 weeks.    SIGNATURES/CONFIDENTIALITY: You and/or your care partner have signed paperwork which will be entered into your electronic medical record.  These signatures attest to the fact that that the information above on your After Visit Summary has been reviewed and is understood.  Full responsibility of the confidentiality of this discharge information lies with you and/or your care-partner.

## 2023-03-09 NOTE — Progress Notes (Signed)
Pt's states no medical or surgical changes since previsit or office visit. VS assessed by this Nurse.

## 2023-03-09 NOTE — Progress Notes (Signed)
GASTROENTEROLOGY PROCEDURE H&P NOTE   Primary Care Physician: Patient, No Pcp Per    Reason for Procedure:   Colon cancer screening  Plan:    Colonoscopy  Patient is appropriate for endoscopic procedure(s) in the ambulatory ([facility]) setting.  The nature of the procedure, as well as the risks, benefits, and alternatives were carefully and thoroughly reviewed with the patient. Ample time for discussion and questions allowed. The patient understood, was satisfied, and agreed to proceed.     HPI: Melissa Zuniga is a 52 y.o. female who presents for colonoscopy for colon cancer screening. Denies blood in stools, changes in bowel habits, or unintentional weight loss. Denies family history of colon cancer.  Past Medical History:  Diagnosis Date   Hyperlipidemia    Osteopenia    Thyroid disease     Past Surgical History:  Procedure Laterality Date   broken elbow repair     EYE SURGERY Left    lazy eye 1977   FOOT SURGERY Right    2021    Prior to Admission medications   Medication Sig Start Date End Date Taking? Authorizing Provider  calcium carbonate (OS-CAL - DOSED IN MG OF ELEMENTAL CALCIUM) 1250 (500 Ca) MG tablet Take 1 tablet by mouth. Slow release   Yes [provider]  levothyroxine (SYNTHROID) 50 MCG tablet Take 50 mcg by mouth daily. 12/12/20  Yes [provider]  Multiple Vitamin (MULTIVITAMIN) capsule Take 1 capsule by mouth daily.   Yes [provider]  Potassium Chloride ER 20 MEQ TBCR TAKE 2 TABLETS BY MOUTH EVERY DAY AS DIRECTED 12/31/22  Yes [provider]  rosuvastatin (CRESTOR) 10 MG tablet TAKE 1 TABLET BY MOUTH EVERYDAY AT BEDTIME 08/16/22  Yes [provider]    Current Outpatient Medications  Medication Sig Dispense Refill   calcium carbonate (OS-CAL - DOSED IN MG OF ELEMENTAL CALCIUM) 1250 (500 Ca) MG tablet Take 1 tablet by mouth. Slow release     levothyroxine (SYNTHROID) 50 MCG tablet Take 50 mcg by mouth  daily.     Multiple Vitamin (MULTIVITAMIN) capsule Take 1 capsule by mouth daily.     Potassium Chloride ER 20 MEQ TBCR TAKE 2 TABLETS BY MOUTH EVERY DAY AS DIRECTED     rosuvastatin (CRESTOR) 10 MG tablet TAKE 1 TABLET BY MOUTH EVERYDAY AT BEDTIME     Current Facility-Administered Medications  Medication Dose Route Frequency Provider Last Rate Last Admin   0.9 %  sodium chloride infusion  500 mL Intravenous Once Imogene Burn, MD        Allergies as of 03/09/2023   (No Known Allergies)    Family History  Problem Relation Age of Onset   Colon cancer Neg Hx    Colon polyps Neg Hx    Esophageal cancer Neg Hx    Rectal cancer Neg Hx    Stomach cancer Neg Hx     Social History   Socioeconomic History   Marital status: Married    Spouse name: Not on file   Number of children: Not on file   Years of education: Not on file   Highest education level: Not on file  Occupational History   Not on file  Tobacco Use   Smoking status: Never   Smokeless tobacco: Never  Vaping Use   Vaping status: Never Used  Substance and Sexual Activity   Alcohol use: Yes    Comment: Rare   Drug use: Never   Sexual activity: Yes  Birth control/protection: I.U.D.  Other Topics Concern   Not on file  Social History Narrative   Not on file   Social Determinants of Health   Financial Resource Strain: Not on file  Food Insecurity: Not on file  Transportation Needs: Not on file  Physical Activity: Not on file  Stress: Not on file  Social Connections: Not on file  Intimate Partner Violence: Not on file    Physical Exam: Vital signs in last 24 hours: BP 111/72   Pulse 72   Temp 97.8 F (36.6 C) (Skin)   Ht 5\' 2"  (1.575 m)   Wt 89 lb (40.4 kg)   SpO2 100%   BMI 16.28 kg/m  GEN: NAD EYE: Sclerae anicteric ENT: MMM CV: Non-tachycardic Pulm: No increased work of breathing GI: Soft, NT/ND NEURO:  Alert & Oriented   Eulah Pont, MD [facility]  03/09/2023 9:22  AM

## 2023-03-09 NOTE — Progress Notes (Signed)
Vss nad trans to pacu 

## 2023-03-10 ENCOUNTER — Telehealth: Payer: Self-pay

## 2023-03-10 NOTE — Telephone Encounter (Signed)
  Follow up Call-     03/09/2023    8:33 AM  Call back number  Post procedure Call Back phone  # [phone]  Permission to leave phone message Yes     Patient questions:  Do you have a fever, pain , or abdominal swelling? No. Pain Score  0 *  Have you tolerated food without any problems? Yes.    Have you been able to return to your normal activities? Yes.    Do you have any questions about your discharge instructions: Diet   No. Medications  No. Follow up visit  No.  Do you have questions or concerns about your Care? No.  Actions: * If pain score is 4 or above: No action needed, pain <4.
